# Patient Record
Sex: Male | Born: 1950 | Race: Black or African American | Hispanic: No | Marital: Single
Health system: Southern US, Community
[De-identification: ages and names within clinical notes are randomized; demographics above are authoritative.]

---

## 2016-11-12 ENCOUNTER — Other Ambulatory Visit: Payer: Self-pay | Admitting: Student

## 2016-11-12 DIAGNOSIS — M5416 Radiculopathy, lumbar region: Secondary | ICD-10-CM

## 2016-11-24 ENCOUNTER — Ambulatory Visit
Admission: RE | Admit: 2016-11-24 | Discharge: 2016-11-24 | Disposition: A | Discharge status: Home / Self Care | Payer: Medicare Other | Source: Ambulatory Visit | Attending: Student | Admitting: Student

## 2016-11-24 DIAGNOSIS — M5134 Other intervertebral disc degeneration, thoracic region: Secondary | ICD-10-CM | POA: Diagnosis not present

## 2016-11-24 DIAGNOSIS — M48061 Spinal stenosis, lumbar region without neurogenic claudication: Secondary | ICD-10-CM | POA: Insufficient documentation

## 2016-11-24 DIAGNOSIS — M5137 Other intervertebral disc degeneration, lumbosacral region: Secondary | ICD-10-CM | POA: Diagnosis not present

## 2016-11-24 DIAGNOSIS — M5136 Other intervertebral disc degeneration, lumbar region: Secondary | ICD-10-CM | POA: Diagnosis not present

## 2016-11-24 DIAGNOSIS — M5416 Radiculopathy, lumbar region: Secondary | ICD-10-CM

## 2020-10-01 ENCOUNTER — Ambulatory Visit: Payer: Self-pay | Attending: Internal Medicine

## 2020-10-03 ENCOUNTER — Other Ambulatory Visit (HOSPITAL_BASED_OUTPATIENT_CLINIC_OR_DEPARTMENT_OTHER): Payer: Self-pay

## 2020-10-03 MED ORDER — COVID-19 MRNA VACC (MODERNA) 100 MCG/0.5ML IM SUSP
INTRAMUSCULAR | 0 refills | Status: AC
  Filled 2020-10-03: qty 0.25, 1d supply, fill #0

## 2020-12-08 ENCOUNTER — Other Ambulatory Visit (HOSPITAL_BASED_OUTPATIENT_CLINIC_OR_DEPARTMENT_OTHER): Payer: Self-pay

## 2021-01-02 ENCOUNTER — Ambulatory Visit: Payer: Medicare Other | Attending: Internal Medicine

## 2021-01-02 DIAGNOSIS — Z23 Encounter for immunization: Secondary | ICD-10-CM

## 2021-01-02 NOTE — Progress Notes (Signed)
   [id number]  Name:  Ricky PERZ Sr.    MRN: [id number] DOB: [birth year]  01/02/2021  Ricky Chen was observed post Covid-19 immunization for 15 minutes without incident. He was provided with Vaccine Information Sheet and instruction to access the [employer] system.   Ricky Chen was instructed to call [phone] with any severe reactions post vaccine: Difficulty breathing  Swelling of face and throat  A fast heartbeat  A bad rash all over body  Dizziness and weakness

## 2021-01-13 ENCOUNTER — Other Ambulatory Visit (HOSPITAL_BASED_OUTPATIENT_CLINIC_OR_DEPARTMENT_OTHER): Payer: Self-pay

## 2021-01-13 MED ORDER — MODERNA COVID-19 BIVAL BOOSTER 50 MCG/0.5ML IM SUSP
INTRAMUSCULAR | 0 refills | Status: AC
  Filled 2021-01-13: qty 0.5, 1d supply, fill #0

## 2021-06-03 ENCOUNTER — Other Ambulatory Visit: Payer: Self-pay | Admitting: Family Medicine

## 2021-06-03 DIAGNOSIS — M5136 Other intervertebral disc degeneration, lumbar region: Secondary | ICD-10-CM

## 2021-06-13 ENCOUNTER — Other Ambulatory Visit: Payer: Self-pay

## 2021-06-13 ENCOUNTER — Ambulatory Visit
Admission: RE | Admit: 2021-06-13 | Discharge: 2021-06-13 | Disposition: A | Discharge status: Home / Self Care | Payer: Medicare Other | Source: Ambulatory Visit | Attending: Family Medicine | Admitting: Family Medicine

## 2021-06-13 DIAGNOSIS — M5136 Other intervertebral disc degeneration, lumbar region: Secondary | ICD-10-CM | POA: Diagnosis present

## 2021-10-07 ENCOUNTER — Ambulatory Visit: Payer: Self-pay

## 2021-10-07 ENCOUNTER — Ambulatory Visit: Payer: Medicare Other | Attending: Internal Medicine

## 2021-10-07 DIAGNOSIS — Z23 Encounter for immunization: Secondary | ICD-10-CM

## 2021-10-13 ENCOUNTER — Other Ambulatory Visit (HOSPITAL_BASED_OUTPATIENT_CLINIC_OR_DEPARTMENT_OTHER): Payer: Self-pay

## 2021-10-13 MED ORDER — PFIZER COVID-19 VAC BIVALENT 30 MCG/0.3ML IM SUSP
INTRAMUSCULAR | 0 refills | Status: AC
  Filled 2021-10-13: qty 0.3, 1d supply, fill #0

## 2021-11-17 ENCOUNTER — Other Ambulatory Visit (HOSPITAL_BASED_OUTPATIENT_CLINIC_OR_DEPARTMENT_OTHER): Payer: Self-pay

## 2022-01-08 ENCOUNTER — Other Ambulatory Visit (HOSPITAL_BASED_OUTPATIENT_CLINIC_OR_DEPARTMENT_OTHER): Payer: Self-pay

## 2022-01-08 MED ORDER — COMIRNATY 30 MCG/0.3ML IM SUSP
INTRAMUSCULAR | 0 refills | Status: AC
  Filled 2022-01-08: qty 0.3, 1d supply, fill #0

## 2022-07-25 IMAGING — MR MR LUMBAR SPINE W/O CM
5 series · 31 of 48 positions shown · non-contrast
Comparison: 11/24/2016

CLINICAL DATA: Fall in 1556 with pain since. Numbness and weakness
in the left leg

EXAM:
MRI LUMBAR SPINE WITHOUT CONTRAST
TECHNIQUE: Multiplanar, multisequence MR imaging of the lumbar spine was
performed. No intravenous contrast was administered.

[Series 5: T2 · sagittal · 4.0mm · 0.81mm/px · 6 of 17 slices shown (1 of 2)]
[im 1/17]
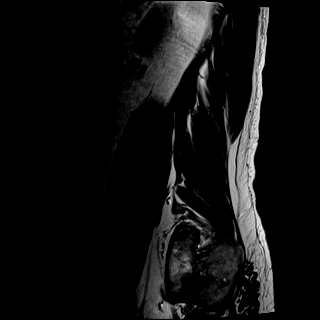
[im 4/17]
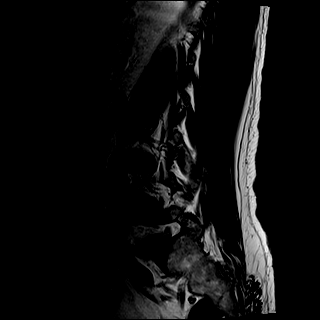
[im 7/17]
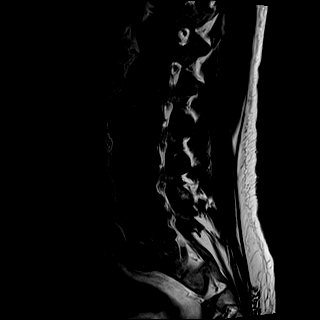
[im 10/17]
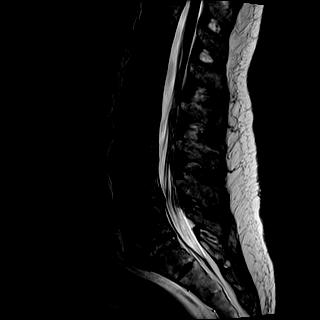
[im 13/17]
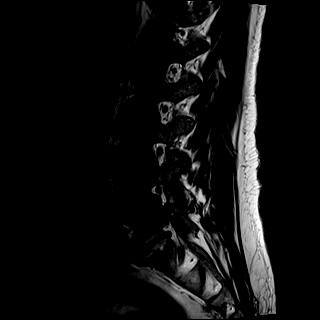
[im 17/17]
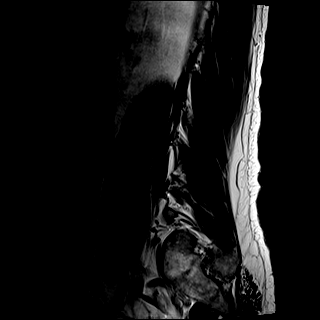

[Series 6: T1 · sagittal · 4.0mm · 0.81mm/px · 7 of 17 slices shown (1 of 2)]
[im 1/17]
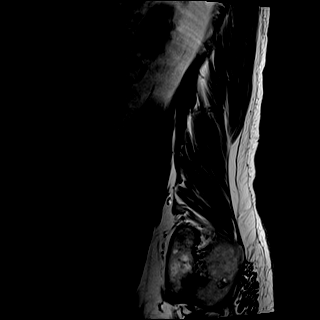
[im 3/17]
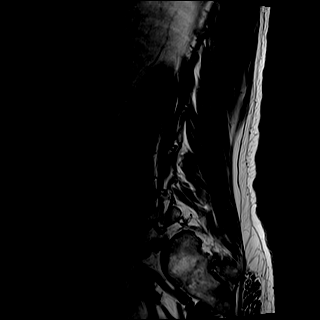
[im 6/17]
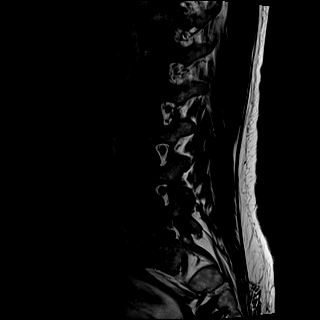
[im 9/17]
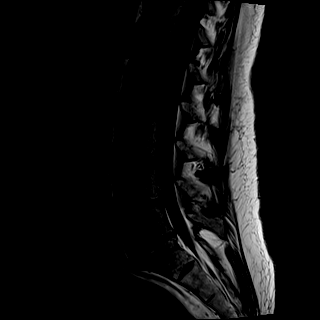
[im 11/17]
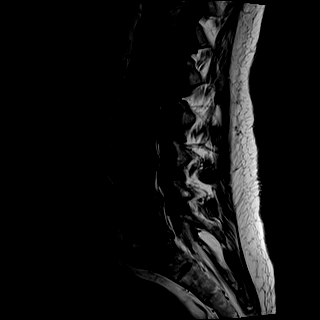
[im 14/17]
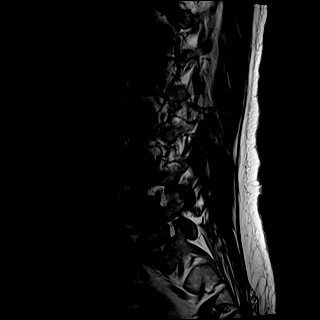
[im 17/17]
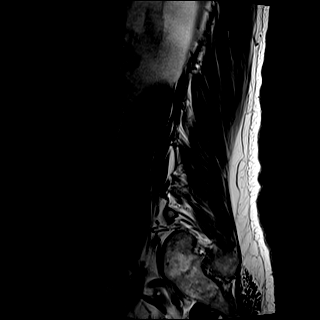

[Series 7: STIR · sagittal · 4.0mm · 0.41mm/px · 2 of 17 slices shown]
[im 1/17]
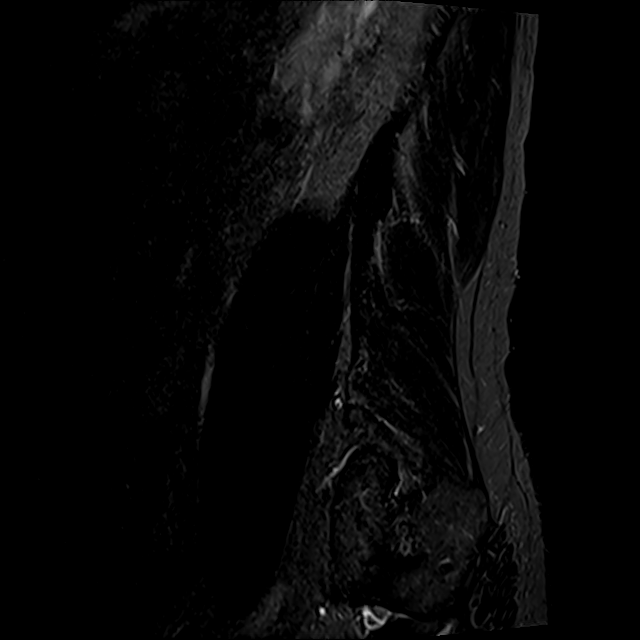
[im 3/17]
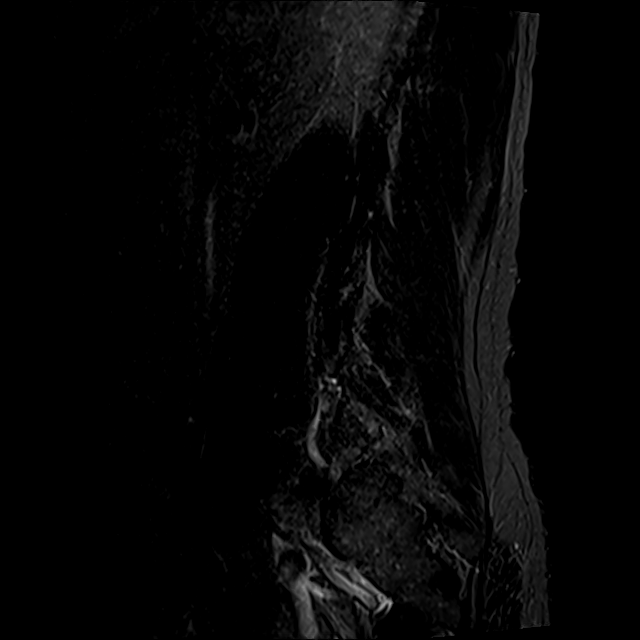

[Series 8: T2 · axial · 4.0mm · 0.78mm/px · z∈[-105,+118]mm · 8 of 36 slices shown (2 of 2)]
[im 1/36]
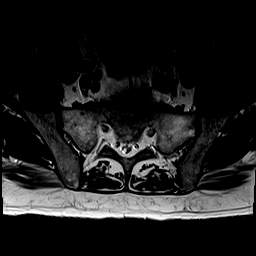
[im 6/36]
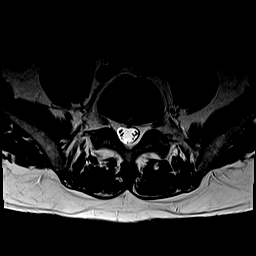
[im 11/36]
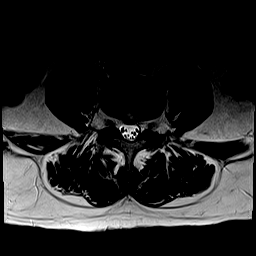
[im 17/36]
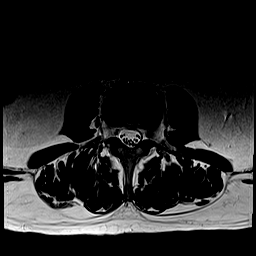
[im 19/36]
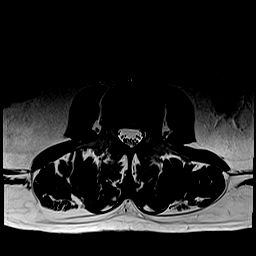
[im 25/36]
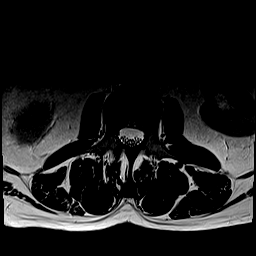
[im 30/36]
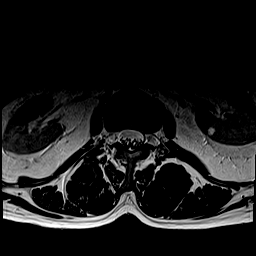
[im 36/36]
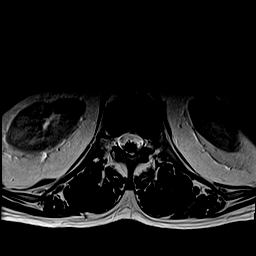

[Series 9: T1 · axial · 4.0mm · 0.39mm/px · z∈[-105,+118]mm · 8 of 36 slices shown (2 of 2)]
[im 1/36]
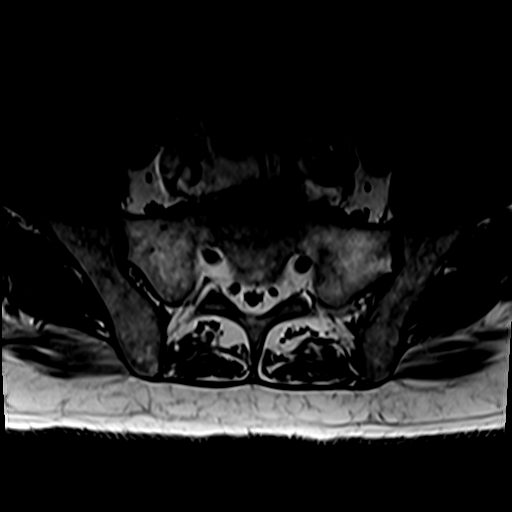
[im 6/36]
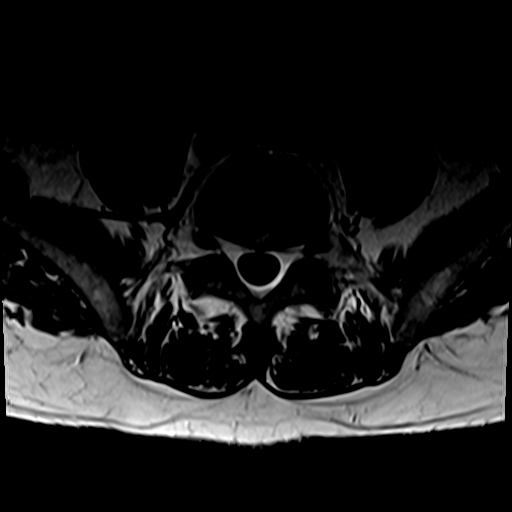
[im 11/36]
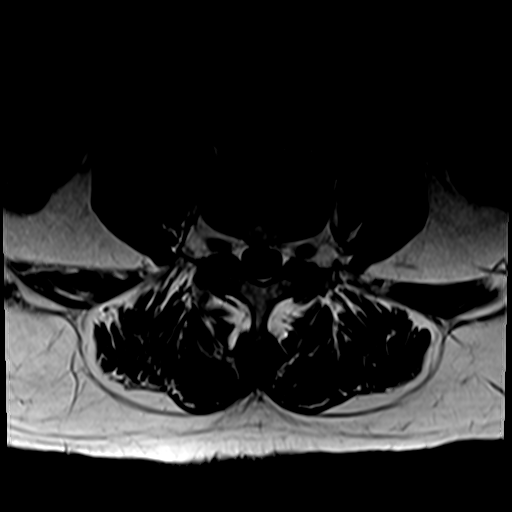
[im 17/36]
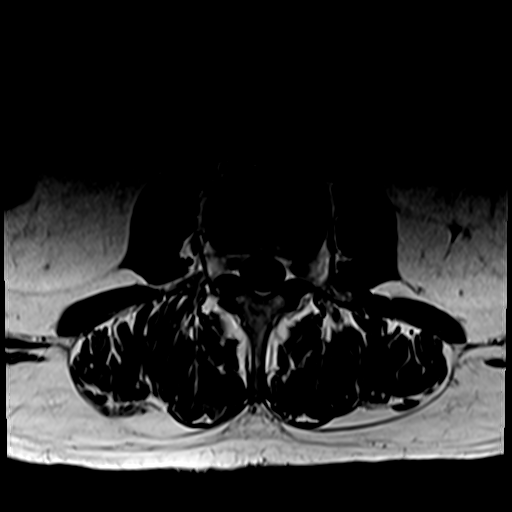
[im 19/36]
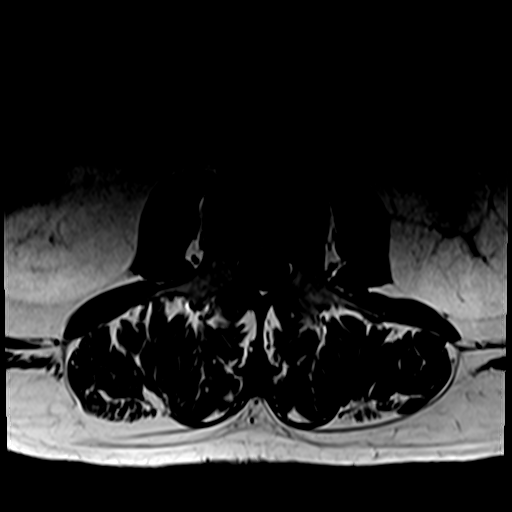
[im 25/36]
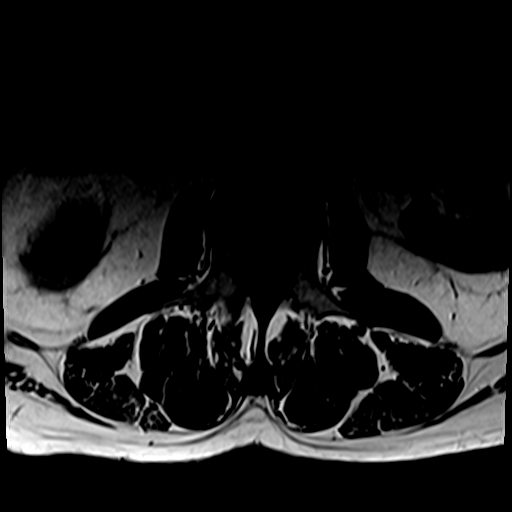
[im 30/36]
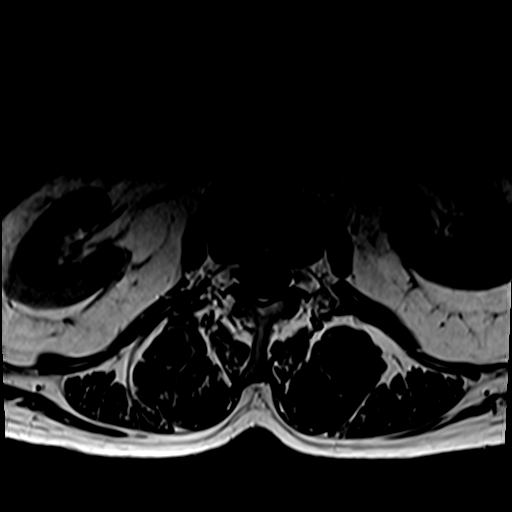
[im 36/36]
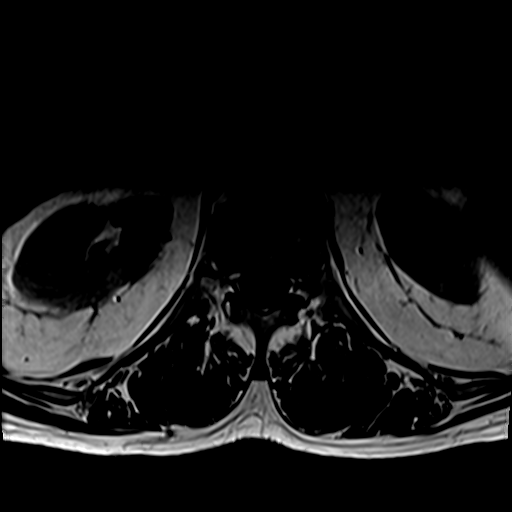

[31 of 48 positions shown; findings below may reference images not displayed]

FINDINGS: Segmentation:  5 lumbar type vertebrae

Alignment:  Physiologic.

Vertebrae:  No fracture, evidence of discitis, or bone lesion.

Conus medullaris and cauda equina: Conus extends to the L1 level.
Conus and cauda equina appear normal.

Paraspinal and other soft tissues: Negative for perispinal mass or
inflammation

Disc levels:

T12- L1: Unremarkable.

L1-L2: Unremarkable.

L2-L3: Ventral spondylitic spurring which is progressed. No
herniation or impingement

L3-L4: Ventral spondylitic spurring which is progressed. Minor facet
spurring.

L4-L5: Disc narrowing and bulging with mild facet spurring and
ligamentous thickening. Patent canal and foramina

L5-S1:Greatest level of disc narrowing with endplate spurring and
fatty degenerative marrow conversion. Mild facet spurring. No neural
impingement
IMPRESSION: Spondylosis and lower lumbar disc degeneration with mild progression
from 0192. No compressive stenosis or visible inflammation.

## 2023-08-09 ENCOUNTER — Other Ambulatory Visit (HOSPITAL_BASED_OUTPATIENT_CLINIC_OR_DEPARTMENT_OTHER): Payer: Self-pay

## 2023-08-09 MED ORDER — COVID-19 MRNA VAC-TRIS(PFIZER) 30 MCG/0.3ML IM SUSY
0.3000 mL | PREFILLED_SYRINGE | Freq: Once | INTRAMUSCULAR | 0 refills | Status: AC
  Filled 2023-08-09: qty 0.3, 1d supply, fill #0
